# Patient Record
Sex: Female | Born: 1980 | Race: Black or African American | Hispanic: No | Marital: Single | Smoking: Light tobacco smoker
Health system: Southern US, Community
[De-identification: ages and names within clinical notes are randomized; demographics above are authoritative.]

## PROBLEM LIST (undated history)

## (undated) DIAGNOSIS — Z789 Other specified health status: Secondary | ICD-10-CM

## (undated) DIAGNOSIS — IMO0002 Reserved for concepts with insufficient information to code with codable children: Secondary | ICD-10-CM

## (undated) HISTORY — PX: APPENDECTOMY: SHX54

## (undated) HISTORY — PX: ABDOMINAL HYSTERECTOMY: SHX81

---
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
## 2002-02-11 ENCOUNTER — Emergency Department (HOSPITAL_COMMUNITY): Admission: EM | Admit: 2002-02-11 | Discharge: 2002-02-11 | Payer: Self-pay | Admitting: Emergency Medicine

## 2004-04-28 ENCOUNTER — Emergency Department (HOSPITAL_COMMUNITY): Admission: EM | Admit: 2004-04-28 | Discharge: 2004-04-28 | Payer: Self-pay | Admitting: Emergency Medicine

## 2005-02-13 ENCOUNTER — Ambulatory Visit: Payer: Self-pay | Admitting: Family Medicine

## 2005-02-27 ENCOUNTER — Ambulatory Visit: Payer: Self-pay | Admitting: Family Medicine

## 2005-03-03 ENCOUNTER — Ambulatory Visit: Payer: Self-pay | Admitting: *Deleted

## 2006-05-12 ENCOUNTER — Emergency Department (HOSPITAL_COMMUNITY): Admission: EM | Admit: 2006-05-12 | Discharge: 2006-05-13 | Payer: Self-pay | Admitting: Emergency Medicine

## 2006-05-28 ENCOUNTER — Ambulatory Visit: Payer: Self-pay | Admitting: Family Medicine

## 2006-07-19 ENCOUNTER — Ambulatory Visit: Payer: Self-pay | Admitting: Family Medicine

## 2006-07-19 ENCOUNTER — Encounter (INDEPENDENT_AMBULATORY_CARE_PROVIDER_SITE_OTHER): Payer: Self-pay | Admitting: Family Medicine

## 2007-01-23 ENCOUNTER — Encounter (INDEPENDENT_AMBULATORY_CARE_PROVIDER_SITE_OTHER): Payer: Self-pay | Admitting: *Deleted

## 2007-03-29 ENCOUNTER — Emergency Department (HOSPITAL_COMMUNITY): Admission: EM | Admit: 2007-03-29 | Discharge: 2007-03-30 | Payer: Self-pay | Admitting: Emergency Medicine

## 2007-04-04 ENCOUNTER — Emergency Department (HOSPITAL_COMMUNITY): Admission: EM | Admit: 2007-04-04 | Discharge: 2007-04-04 | Payer: Self-pay | Admitting: Emergency Medicine

## 2007-04-12 ENCOUNTER — Emergency Department (HOSPITAL_COMMUNITY): Admission: EM | Admit: 2007-04-12 | Discharge: 2007-04-12 | Payer: Self-pay | Admitting: Family Medicine

## 2007-05-16 ENCOUNTER — Emergency Department (HOSPITAL_COMMUNITY): Admission: EM | Admit: 2007-05-16 | Discharge: 2007-05-16 | Payer: Self-pay | Admitting: Emergency Medicine

## 2007-07-02 ENCOUNTER — Emergency Department (HOSPITAL_COMMUNITY): Admission: EM | Admit: 2007-07-02 | Discharge: 2007-07-03 | Payer: Self-pay | Admitting: Emergency Medicine

## 2007-07-24 ENCOUNTER — Encounter (HOSPITAL_COMMUNITY): Admission: RE | Admit: 2007-07-24 | Discharge: 2007-10-22 | Payer: Self-pay | Admitting: Emergency Medicine

## 2008-09-22 ENCOUNTER — Emergency Department (HOSPITAL_COMMUNITY): Admission: EM | Admit: 2008-09-22 | Discharge: 2008-09-22 | Payer: Self-pay | Admitting: Emergency Medicine

## 2009-02-21 ENCOUNTER — Inpatient Hospital Stay (HOSPITAL_COMMUNITY): Admission: EM | Admit: 2009-02-21 | Discharge: 2009-02-22 | Payer: Self-pay | Admitting: Emergency Medicine

## 2009-02-21 ENCOUNTER — Encounter (INDEPENDENT_AMBULATORY_CARE_PROVIDER_SITE_OTHER): Payer: Self-pay | Admitting: Surgery

## 2009-12-28 ENCOUNTER — Emergency Department (HOSPITAL_COMMUNITY): Admission: EM | Admit: 2009-12-28 | Discharge: 2009-12-28 | Payer: Self-pay | Admitting: Emergency Medicine

## 2010-07-22 LAB — CBC
Hemoglobin: 13.2 g/dL (ref 12.0–15.0)
MCH: 32.9 pg (ref 26.0–34.0)
Platelets: 308 10*3/uL (ref 150–400)
RBC: 4.01 MIL/uL (ref 3.87–5.11)
WBC: 12 10*3/uL — ABNORMAL HIGH (ref 4.0–10.5)

## 2010-07-22 LAB — GC/CHLAMYDIA PROBE AMP, GENITAL
Chlamydia, DNA Probe: NEGATIVE
GC Probe Amp, Genital: NEGATIVE

## 2010-07-22 LAB — DIFFERENTIAL
Eosinophils Absolute: 0.2 10*3/uL (ref 0.0–0.7)
Lymphocytes Relative: 29 % (ref 12–46)
Lymphs Abs: 3.5 10*3/uL (ref 0.7–4.0)
Monocytes Relative: 6 % (ref 3–12)
Neutro Abs: 7.4 10*3/uL (ref 1.7–7.7)
Neutrophils Relative %: 62 % (ref 43–77)

## 2010-07-22 LAB — WET PREP, GENITAL: WBC, Wet Prep HPF POC: NONE SEEN

## 2010-07-22 LAB — URINALYSIS, ROUTINE W REFLEX MICROSCOPIC
Bilirubin Urine: NEGATIVE
Glucose, UA: NEGATIVE mg/dL
Protein, ur: NEGATIVE mg/dL

## 2010-07-22 LAB — RPR: RPR Ser Ql: NONREACTIVE

## 2010-08-11 LAB — CBC
HCT: 35.1 % — ABNORMAL LOW (ref 36.0–46.0)
MCV: 96.8 fL (ref 78.0–100.0)
Platelets: 326 10*3/uL (ref 150–400)
RDW: 14.7 % (ref 11.5–15.5)
WBC: 13.6 10*3/uL — ABNORMAL HIGH (ref 4.0–10.5)

## 2010-08-11 LAB — URINALYSIS, ROUTINE W REFLEX MICROSCOPIC
Nitrite: NEGATIVE
Protein, ur: NEGATIVE mg/dL
Specific Gravity, Urine: 1.041 — ABNORMAL HIGH (ref 1.005–1.030)
Urobilinogen, UA: 0.2 mg/dL (ref 0.0–1.0)

## 2010-08-11 LAB — WET PREP, GENITAL
Trich, Wet Prep: NONE SEEN
Yeast Wet Prep HPF POC: NONE SEEN

## 2010-08-11 LAB — POCT I-STAT, CHEM 8
BUN: 17 mg/dL (ref 6–23)
Chloride: 104 mEq/L (ref 96–112)
HCT: 36 % (ref 36.0–46.0)
Potassium: 4.4 mEq/L (ref 3.5–5.1)

## 2010-08-11 LAB — URINE MICROSCOPIC-ADD ON

## 2010-08-11 LAB — DIFFERENTIAL
Blasts: 0 %
Eosinophils Absolute: 0.3 10*3/uL (ref 0.0–0.7)
Eosinophils Relative: 2 % (ref 0–5)
Metamyelocytes Relative: 0 %
Monocytes Relative: 5 % (ref 3–12)
Myelocytes: 0 %
Neutro Abs: 9.1 10*3/uL — ABNORMAL HIGH (ref 1.7–7.7)
Neutrophils Relative %: 67 % (ref 43–77)
nRBC: 0 /100 WBC

## 2010-08-11 LAB — URINE CULTURE: Colony Count: 50000

## 2010-08-11 LAB — RPR: RPR Ser Ql: NONREACTIVE

## 2011-02-14 LAB — D-DIMER, QUANTITATIVE: D-Dimer, Quant: 0.3

## 2011-05-09 DIAGNOSIS — R87619 Unspecified abnormal cytological findings in specimens from cervix uteri: Secondary | ICD-10-CM

## 2011-05-09 DIAGNOSIS — IMO0002 Reserved for concepts with insufficient information to code with codable children: Secondary | ICD-10-CM

## 2011-05-09 HISTORY — DX: Reserved for concepts with insufficient information to code with codable children: IMO0002

## 2011-05-09 HISTORY — DX: Unspecified abnormal cytological findings in specimens from cervix uteri: R87.619

## 2012-03-23 ENCOUNTER — Encounter (HOSPITAL_COMMUNITY): Payer: Self-pay | Admitting: Obstetrics and Gynecology

## 2012-03-23 ENCOUNTER — Inpatient Hospital Stay (HOSPITAL_COMMUNITY)
Admission: AD | Admit: 2012-03-23 | Discharge: 2012-03-23 | Disposition: A | Discharge status: Home / Self Care | Payer: Medicaid Other | Source: Ambulatory Visit | Attending: Obstetrics & Gynecology | Admitting: Obstetrics & Gynecology

## 2012-03-23 ENCOUNTER — Inpatient Hospital Stay (HOSPITAL_COMMUNITY): Payer: Medicaid Other

## 2012-03-23 DIAGNOSIS — R1032 Left lower quadrant pain: Secondary | ICD-10-CM | POA: Insufficient documentation

## 2012-03-23 DIAGNOSIS — D259 Leiomyoma of uterus, unspecified: Secondary | ICD-10-CM

## 2012-03-23 HISTORY — DX: Reserved for concepts with insufficient information to code with codable children: IMO0002

## 2012-03-23 LAB — URINALYSIS, ROUTINE W REFLEX MICROSCOPIC
Bilirubin Urine: NEGATIVE
Glucose, UA: NEGATIVE mg/dL
Hgb urine dipstick: NEGATIVE
Specific Gravity, Urine: 1.025 (ref 1.005–1.030)
Urobilinogen, UA: 0.2 mg/dL (ref 0.0–1.0)
pH: 6 (ref 5.0–8.0)

## 2012-03-23 LAB — WET PREP, GENITAL
Clue Cells Wet Prep HPF POC: NONE SEEN
Trich, Wet Prep: NONE SEEN
Yeast Wet Prep HPF POC: NONE SEEN

## 2012-03-23 LAB — POCT PREGNANCY, URINE: Preg Test, Ur: NEGATIVE

## 2012-03-23 MED ORDER — IBUPROFEN 800 MG PO TABS: 800.0000 mg | ORAL_TABLET | Freq: Three times a day (TID) | ORAL | Status: DC | PRN

## 2012-03-23 NOTE — MAU Note (Signed)
"  I was sleeping in bed last night and I just started having a pain in my LLQ area.  It is mainly sore now.  No pain or burning with urination, but I have had increased frequency for the past 2 days."

## 2012-03-23 NOTE — MAU Note (Signed)
First noted pain last night.  Hard to get comfortable when in bed.  Denies any pain or burning with urination, does have frequency- states sometimes does not feel like bladder is empty.  Sometimes is nauseated if goes a long time without eating.  No constipation, has occ diarrhea- not a new issue.

## 2012-03-23 NOTE — MAU Provider Note (Signed)
Attestation of Attending Supervision of Advanced Practitioner (CNM/NP): Evaluation and management procedures were performed by the Advanced Practitioner under my supervision and collaboration. I have reviewed the Advanced Practitioner's note and chart, and I agree with the management and plan.  Jailan Trimm H. 4:34 PM   

## 2012-03-23 NOTE — MAU Provider Note (Signed)
History     CSN: [id number]  Arrival date and time: 03/23/12 1143   None     Chief Complaint  Patient presents with  . Abdominal Pain   Abdominal Pain This is a new problem. The current episode started today. The onset quality is sudden. The problem occurs constantly. The problem has been gradually improving. The pain is located in the LLQ. The pain is mild. The abdominal pain radiates to the RLQ. Pertinent negatives include no constipation, diarrhea (pt does report some diarrhea, but states this is a ongoing issue that she feels is related to her alcohol intake), dysuria, fever, frequency, hematuria, nausea or vomiting. Nothing aggravates the pain. The pain is relieved by nothing. She has tried nothing for the symptoms. Her past medical history is significant for abdominal surgery (appendectomy).   Past Medical History  Diagnosis Date  . Abnormal Pap smear 2013    "a few months ago.  I have to have it repeated"    Past Surgical History  Procedure Date  . Appendectomy 4 yrs ago    Family History  Problem Relation Age of Onset  . Cancer Father   . Cancer Paternal Aunt   . Cancer Paternal Grandmother     History  Substance Use Topics  . Smoking status: Light Tobacco Smoker -- 0.2 packs/day for 2 years    Types: Cigarettes  . Smokeless tobacco: Not on file  . Alcohol Use: 7.8 oz/week    5 Cans of beer, 8 Shots of liquor per week    Allergies: No Known Allergies  No prescriptions prior to admission    Review of Systems  Constitutional: Negative.  Negative for fever.  Respiratory: Negative.   Cardiovascular: Negative.   Gastrointestinal: Positive for abdominal pain. Negative for nausea, vomiting, diarrhea (pt does report some diarrhea, but states this is a ongoing issue that she feels is related to her alcohol intake) and constipation.  Genitourinary: Negative for dysuria, urgency, frequency, hematuria and flank pain.       Negative for vaginal bleeding, vaginal  discharge, dyspareunia  Musculoskeletal: Negative.   Neurological: Negative.   Psychiatric/Behavioral: Negative.    Physical Exam   Blood pressure 136/88, pulse 92, temperature 98.8 F (37.1 C), temperature source Oral, resp. rate 18, height 5\' 1"  (1.549 m), weight 191 lb (86.637 kg), last menstrual period 02/24/2012.  Physical Exam  Nursing note and vitals reviewed. Constitutional: She is oriented to person, place, and time. She appears well-developed and well-nourished. No distress.  HENT:  Head: Normocephalic and atraumatic.  Cardiovascular: Normal rate.   Respiratory: Effort normal. No respiratory distress.  GI: Soft. She exhibits no distension and no mass. There is no tenderness. There is no rebound and no guarding.  Genitourinary: There is no rash or lesion on the right labia. There is no rash or lesion on the left labia. Uterus is not deviated, not enlarged, not fixed and not tender. Cervix exhibits no motion tenderness, no discharge and no friability. Right adnexum displays no mass, no tenderness and no fullness. Left adnexum displays tenderness and fullness. Left adnexum displays no mass. No erythema, tenderness or bleeding around the vagina. Vaginal discharge (creamy white) found.  Neurological: She is alert and oriented to person, place, and time.  Skin: Skin is warm and dry.  Psychiatric: She has a normal mood and affect.    MAU Course  Procedures Results for orders placed during the hospital encounter of 03/23/12 (from the past 24 hour(s))  URINALYSIS, ROUTINE W REFLEX  MICROSCOPIC     Status: Normal   Collection Time   03/23/12 11:50 AM      Component Value Range   Color, Urine YELLOW  YELLOW   APPearance CLEAR  CLEAR   Specific Gravity, Urine 1.025  1.005 - 1.030   pH 6.0  5.0 - 8.0   Glucose, UA NEGATIVE  NEGATIVE mg/dL   Hgb urine dipstick NEGATIVE  NEGATIVE   Bilirubin Urine NEGATIVE  NEGATIVE   Ketones, ur NEGATIVE  NEGATIVE mg/dL   Protein, ur NEGATIVE   NEGATIVE mg/dL   Urobilinogen, UA 0.2  0.0 - 1.0 mg/dL   Nitrite NEGATIVE  NEGATIVE   Leukocytes, UA NEGATIVE  NEGATIVE  POCT PREGNANCY, URINE     Status: Normal   Collection Time   03/23/12 12:01 PM      Component Value Range   Preg Test, Ur NEGATIVE  NEGATIVE  WET PREP, GENITAL     Status: Abnormal   Collection Time   03/23/12 12:34 PM      Component Value Range   Yeast Wet Prep HPF POC NONE SEEN  NONE SEEN   Trich, Wet Prep NONE SEEN  NONE SEEN   Clue Cells Wet Prep HPF POC NONE SEEN  NONE SEEN   WBC, Wet Prep HPF POC FEW (*) NONE SEEN   US Transvaginal Non-ob  03/23/2012  *RADIOLOGY REPORT*  Clinical Data: Left adnexal pain.  TRANSABDOMINAL AND TRANSVAGINAL ULTRASOUND OF PELVIS Technique:  Both transabdominal and transvaginal ultrasound examinations of the pelvis were performed. Transabdominal technique was performed for global imaging of the pelvis including uterus, ovaries, adnexal regions, and pelvic cul-de-sac.  It was necessary to proceed with endovaginal exam following the transabdominal exam to visualize the all adnexa.  Comparison:  No priors.  Findings:  Uterus: Markedly heterogeneous in echotexture with numerous focal lesions most compatible with fibroids.  22 x 7.5 x 17.9 cm.  The largest single fibroid is on the right side measuring up to 11.2 x 8.6 x 11.9 cm.  There are at least six separate lesions (likely more) visualized.  Endometrium: Largely obscured, but the visualized portion measures up to 7.7 mm in thickness.  Right ovary:  3.8 x 2.8 x 2.8 cm.  Normal in echotexture and appearance with multiple small follicles.  Left ovary: Could not be visualized.  Other findings: No free fluid the cul-de-sac.  IMPRESSION: 1.  Fibroid uterus, as above. 2.  Normal sonographic appearance of the right ovary. 3.  Limited examination which was not able to visualize the left ovary.   Original Report Authenticated By: Trudie Reed, M.D.    US Pelvis Complete  03/23/2012  *RADIOLOGY  REPORT*  Clinical Data: Left adnexal pain.  TRANSABDOMINAL AND TRANSVAGINAL ULTRASOUND OF PELVIS Technique:  Both transabdominal and transvaginal ultrasound examinations of the pelvis were performed. Transabdominal technique was performed for global imaging of the pelvis including uterus, ovaries, adnexal regions, and pelvic cul-de-sac.  It was necessary to proceed with endovaginal exam following the transabdominal exam to visualize the all adnexa.  Comparison:  No priors.  Findings:  Uterus: Markedly heterogeneous in echotexture with numerous focal lesions most compatible with fibroids.  22 x 7.5 x 17.9 cm.  The largest single fibroid is on the right side measuring up to 11.2 x 8.6 x 11.9 cm.  There are at least six separate lesions (likely more) visualized.  Endometrium: Largely obscured, but the visualized portion measures up to 7.7 mm in thickness.  Right ovary:  3.8 x 2.8 x  2.8 cm.  Normal in echotexture and appearance with multiple small follicles.  Left ovary: Could not be visualized.  Other findings: No free fluid the cul-de-sac.  IMPRESSION: 1.  Fibroid uterus, as above. 2.  Normal sonographic appearance of the right ovary. 3.  Limited examination which was not able to visualize the left ovary.   Original Report Authenticated By: Trudie Reed, M.D.     Assessment and Plan   1. Fibroid uterus       Medication List     As of 03/23/2012  2:53 PM    START taking these medications         ibuprofen 800 MG tablet   Commonly known as: ADVIL,MOTRIN   Take 1 tablet (800 mg total) by mouth every 8 (eight) hours as needed for pain.          Where to get your medications    These are the prescriptions that you need to pick up. We sent them to a specific pharmacy, so you will need to go there to get them.   [id number] [place], [place] - [address]    [address] [place] [address]    Phone: [phone]        ibuprofen 800 MG tablet             Follow-up Information    Follow up with [facility]. (someone will call to schedule an appointment)    Contact information:   [address] [place] [address] [phone]           Gurney Balthazor 03/23/2012, 2:53 PM

## 2012-03-25 LAB — GC/CHLAMYDIA PROBE AMP, GENITAL: Chlamydia, DNA Probe: NEGATIVE

## 2012-04-24 ENCOUNTER — Ambulatory Visit (INDEPENDENT_AMBULATORY_CARE_PROVIDER_SITE_OTHER): Payer: Medicaid Other | Admitting: Obstetrics & Gynecology

## 2012-04-24 ENCOUNTER — Other Ambulatory Visit (HOSPITAL_COMMUNITY)
Admission: RE | Admit: 2012-04-24 | Discharge: 2012-04-24 | Disposition: A | Discharge status: Home / Self Care | Payer: Medicaid Other | Source: Ambulatory Visit | Attending: Obstetrics & Gynecology | Admitting: Obstetrics & Gynecology

## 2012-04-24 ENCOUNTER — Encounter: Payer: Self-pay | Admitting: Obstetrics & Gynecology

## 2012-04-24 VITALS — BP 126/87 | HR 95 | Temp 96.9°F | Ht 61.0 in | Wt 196.1 lb

## 2012-04-24 DIAGNOSIS — Z01419 Encounter for gynecological examination (general) (routine) without abnormal findings: Secondary | ICD-10-CM

## 2012-04-24 DIAGNOSIS — D259 Leiomyoma of uterus, unspecified: Secondary | ICD-10-CM

## 2012-04-24 DIAGNOSIS — Z0000 Encounter for general adult medical examination without abnormal findings: Secondary | ICD-10-CM

## 2012-04-24 DIAGNOSIS — D219 Benign neoplasm of connective and other soft tissue, unspecified: Secondary | ICD-10-CM

## 2012-04-24 LAB — CBC
MCV: 96.5 fL (ref 78.0–100.0)
Platelets: 437 10*3/uL — ABNORMAL HIGH (ref 150–400)
RBC: 3.98 MIL/uL (ref 3.87–5.11)
WBC: 10.9 10*3/uL — ABNORMAL HIGH (ref 4.0–10.5)

## 2012-04-24 NOTE — Progress Notes (Signed)
  Subjective:    Patient ID: Sara Barber, female    DOB: [birth year], 31 y.o.   MRN: [id number]  HPI  31 yo S AA G0 who is here for follow up of her MAU visit last month. She gets her pap smears at [facility] and is due for a follow up pap. She was seen in the MAU for pelvic pain. An u/s showed a 22 cm uterus with fibroids. She does not want children and is in an 8 year monogamous same- sex relationship. Review of Systems     Objective:   Physical Exam   Normal appearing cervix, nulliparous, scant discharge, some bleeding Uterus well above the umbilicus, with what feels like a pedunculated fibroid on the right side of the uterus     Assessment & Plan:   Preventative care- pap smear today Large painful fibroids- I will give her information on TAH as well as Uterine artery embolization. I will check a TSH and CBC.

## 2012-04-24 NOTE — Patient Instructions (Addendum)
Hysterectomy Information  A hysterectomy is a procedure where your uterus is surgically removed. It will no longer be possible to have menstrual periods or to become pregnant. The tubes and ovaries can be removed (bilateral salpingo-oopherectomy) during this surgery as well.  REASONS FOR A HYSTERECTOMY  Persistent, abnormal bleeding.  Lasting (chronic) pelvic pain or infection.  The lining of the uterus (endometrium) starts growing outside the uterus (endometriosis).  The endometrium starts growing in the muscle of the uterus (adenomyosis).  The uterus falls down into the vagina (pelvic organ prolapse).  Symptomatic uterine fibroids.  Precancerous cells.  Cervical cancer or uterine cancer. TYPES OF HYSTERECTOMIES  Supracervical hysterectomy. This type removes the top part of the uterus, but not the cervix.  Total hysterectomy. This type removes the uterus and cervix.  Radical hysterectomy. This type removes the uterus, cervix, and the fibrous tissue that holds the uterus in place in the pelvis (parametrium). WAYS A HYSTERECTOMY CAN BE PERFORMED  Abdominal hysterectomy. A large surgical cut (incision) is made in the abdomen. The uterus is removed through this incision.  Vaginal hysterectomy. An incision is made in the vagina. The uterus is removed through this incision. There are no abdominal incisions.  Conventional laparoscopic hysterectomy. A thin, lighted tube with a camera (laparoscope) is inserted into 3 or 4 small incisions in the abdomen. The uterus is cut into small pieces. The small pieces are removed through the incisions, or they are removed through the vagina.  Laparoscopic assisted vaginal hysterectomy (LAVH). Three or four small incisions are made in the abdomen. Part of the surgery is performed laparoscopically and part vaginally. The uterus is removed through the vagina.  Robot-assisted laparoscopic hysterectomy. A laparoscope is inserted into 3 or 4 small  incisions in the abdomen. A computer-controlled device is used to give the surgeon a 3D image. This allows for more precise movements of surgical instruments. The uterus is cut into small pieces and removed through the incisions or removed through the vagina. RISKS OF HYSTERECTOMY   Bleeding and risk of blood transfusion. Tell your caregiver if you do not want to receive any blood products.  Blood clots in the legs or lung.  Infection.  Injury to surrounding organs.  Anesthesia problems or side effects.  Conversion to an abdominal hysterectomy. WHAT TO EXPECT AFTER A HYSTERECTOMY  You will be given pain medicine.  You will need to have someone with you for the first 3 to 5 days after you go home.  You will need to follow up with your surgeon in 2 to 4 weeks after surgery to evaluate your progress.  You may have early menopause symptoms like hot flashes, night sweats, and insomnia.  If you had a hysterectomy for a problem that was not a cancer or a condition that could lead to cancer, then you no longer need Pap tests. However, even if you no longer need a Pap test, a regular exam is a good idea to make sure no other problems are starting. Document Released: 10/18/2000 Document Revised: 07/17/2011 Document Reviewed: 12/03/2010 [facility] Patient Information 2013 [place], [place]. Fibroids Fibroids are lumps (tumors) that can occur any place in a woman's body. These lumps are not cancerous. Fibroids vary in size, weight, and where they grow. HOME CARE  Do not take aspirin.  Write down the number of pads or tampons you use during your period. Tell your doctor. This can help determine the best treatment for you. GET HELP RIGHT AWAY IF:  You have pain in  your lower belly (abdomen) that is not helped with medicine.  You have cramps that are not helped with medicine.  You have more bleeding between or during your period.  You feel lightheaded or pass out (faint).  Your lower belly  pain gets worse. MAKE SURE YOU:  Understand these instructions.  Will watch your condition.  Will get help right away if you are not doing well or get worse. Document Released: 05/27/2010 Document Revised: 07/17/2011 Document Reviewed: 05/27/2010 [facility] Patient Information 2013 [place], [place]. Uterine Artery Embolization for Fibroids Uterine fibroids are non-cancerous (benign) smooth muscle tumors of the uterus. When they become large, they may produce symptoms of pain and bleeding. Fibroids are sometimes individually removed during surgery or removed with the uterus (hysterectomy).  One non-surgical treatment used to shrink fibroids is called uterine artery embolization. A specialist (interventional radiologist) uses a thin plastic hose(catheter) to inject material that blocks off the blood supply to the fibroid. In time, this causes the fibroid to shrink. PROCEDURE  Under local anesthetic (a medication that numbs part of the body) the radiologist makes a small cut in the groin. A catheter is then inserted into the main artery of the leg. Using fluoroscopy, your radiologist guides the catheter through the artery to the uterus. A series of images are taken while dye is injected. This is done to provide a road map of the blood supply to the uterus and fibroids. Tiny plastic spheres about the size of sand grains are then injected through the catheter. Metal coils may sometimes also be used to help block the artery. The particles lodge in tiny branches of the uterine artery that supplies blood to the fibroids. The procedure is repeated on the artery that supplies the other side of the uterus. The hospital stay is usually overnight. Normal activity can resume after about a week. Mild pain and cramping following the procedure is easily treated with medication and anti-inflammatory drugs. These usually last only a couple days.  RISKS AND COMPLICATIONS  Injury to the uterus from decreased blood supply  may happen.  This could require removal of the uterus (hysterectomy).  Pain and bleeding can occur.  Infection and abscess (a cyst filled with pus).  A cyst filled with blood (hematoma).  Blood infection (septicemia).  Amenorrhea (no menstrual period).  Dying of tissue cells that cannot recover (necrosis) to the bladder or lips of the vagina.  Fistula (a connection between organs or from organ to the skin).  Blood clot in the lung (pulmonary embolus).  Rarely death. EXPECTED OUTCOME An ultrasound or MRI is done in 6 months to make sure the fibroids have shrunk. The fibroids usually shrink to about half their original size. In most cases these effects are long lasting.   The uterus also shrinks but does not die. You may not be able to get pregnant following this procedure.  It cannot be estimated what the effects of the procedure will be on menses. Usually there is less bleeding.  The procedure may cause premature menopause or loss of menstrual cycle. HOME CARE INSTRUCTIONS   Follow your caregiver's advice regarding medications given to you, diet, activity and when to begin sexual activity.  See your caregiver for follow up care as directed.  Do not take aspirin it can cause bleeding. Only take over-the-counter or prescription medicines for pain, discomfort, or fever as directed by your caregiver.  Care for and change dressing as directed. SEEK MEDICAL CARE IF:   You develop a temperature of  102 F (38.9 C) or higher.  There is redness, swelling and pain around the wound.  You have pus draining from the wound.  You develop a rash. SEEK IMMEDIATE MEDICAL CARE IF:   You have bleeding from the wound.  You have difficulty breathing.  You develop chest pain.  You develop belly (abdominal) pain.  You develop leg pain.  You become dizzy and pass out. Document Released: 07/10/2005 Document Revised: 07/17/2011 Document Reviewed: 06/06/2007 [facility] Patient  Information 2013 [place], [place].

## 2012-05-29 ENCOUNTER — Ambulatory Visit: Payer: Medicaid Other | Admitting: Obstetrics & Gynecology

## 2012-06-03 ENCOUNTER — Ambulatory Visit (INDEPENDENT_AMBULATORY_CARE_PROVIDER_SITE_OTHER): Payer: Medicaid Other | Admitting: Obstetrics & Gynecology

## 2012-06-03 ENCOUNTER — Encounter: Payer: Self-pay | Admitting: Obstetrics & Gynecology

## 2012-06-03 VITALS — BP 128/81 | HR 100 | Temp 97.3°F | Ht 60.0 in | Wt 197.1 lb

## 2012-06-03 DIAGNOSIS — D219 Benign neoplasm of connective and other soft tissue, unspecified: Secondary | ICD-10-CM

## 2012-06-03 DIAGNOSIS — D259 Leiomyoma of uterus, unspecified: Secondary | ICD-10-CM

## 2012-06-03 NOTE — Progress Notes (Signed)
  Subjective:    Patient ID: Sara Barber, female    DOB: January 25, 1981, 32 y.o.   MRN: [id number]  HPI  She is here today to discuss her options for her 22 cm fibroid uterus. She declines [place], depo Lupron. She wants a [facility].  Review of Systems Pap normal 12/13 She declines a flu vaccine today.    Objective:   Physical Exam        Assessment & Plan:   Pelvic pain, 22 week size uterus- I sent [place] an email to schedule her surgery. She would like a depo Lupron shot until her surgery.

## 2012-07-12 ENCOUNTER — Encounter (HOSPITAL_COMMUNITY): Payer: Self-pay | Admitting: Pharmacist

## 2012-07-24 ENCOUNTER — Encounter (HOSPITAL_COMMUNITY): Payer: Self-pay

## 2012-07-24 ENCOUNTER — Encounter (HOSPITAL_COMMUNITY)
Admission: RE | Admit: 2012-07-24 | Discharge: 2012-07-24 | Disposition: A | Discharge status: Home / Self Care | Payer: Medicaid Other | Source: Ambulatory Visit | Attending: Obstetrics & Gynecology | Admitting: Obstetrics & Gynecology

## 2012-07-24 HISTORY — DX: Other specified health status: Z78.9

## 2012-07-24 LAB — CBC
Hemoglobin: 12.1 g/dL (ref 12.0–15.0)
MCH: 31.6 pg (ref 26.0–34.0)
RBC: 3.83 MIL/uL — ABNORMAL LOW (ref 3.87–5.11)
WBC: 10.3 10*3/uL (ref 4.0–10.5)

## 2012-07-24 LAB — SURGICAL PCR SCREEN
MRSA, PCR: INVALID — AB
Staphylococcus aureus: INVALID — AB

## 2012-07-24 NOTE — Patient Instructions (Addendum)
20 Sara Barber  07/24/2012   Your procedure is scheduled on:  07/29/12  Enter through the Main Entrance of [facility] at 8 AM.  Pick up the phone at the desk and dial [phone].   Call this number if you have problems the morning of surgery: [phone]   Remember:   Do not eat food:After Midnight.  Do not drink clear liquids: After Midnight.  Take these medicines the morning of surgery with A SIP OF WATER: NA   Do not wear jewelry, make-up or nail polish.  Do not wear lotions, powders, or perfumes. You may wear deodorant.  Do not shave 48 hours prior to surgery.  Do not bring valuables to the hospital.  Contacts, dentures or bridgework may not be worn into surgery.  Leave suitcase in the car. After surgery it may be brought to your room.  For patients admitted to the hospital, checkout time is 11:00 AM the day of discharge.   Patients discharged the day of surgery will not be allowed to drive home.  Name and phone number of your driver: NA  Special Instructions: Shower using CHG 2 nights before surgery and the night before surgery.  If you shower the day of surgery use CHG.  Use special wash - you have one bottle of CHG for all showers.  You should use approximately 1/3 of the bottle for each shower.   Please read over the following fact sheets that you were given: MRSA Information

## 2012-07-26 LAB — MRSA CULTURE

## 2012-07-29 ENCOUNTER — Encounter (HOSPITAL_COMMUNITY): Payer: Self-pay | Admitting: Anesthesiology

## 2012-07-29 ENCOUNTER — Encounter (HOSPITAL_COMMUNITY)
Admission: RE | Disposition: A | Discharge status: Home / Self Care | Payer: Self-pay | Source: Ambulatory Visit | Attending: Obstetrics & Gynecology

## 2012-07-29 ENCOUNTER — Inpatient Hospital Stay (HOSPITAL_COMMUNITY): Payer: Medicaid Other | Admitting: Anesthesiology

## 2012-07-29 ENCOUNTER — Inpatient Hospital Stay (HOSPITAL_COMMUNITY)
Admission: RE | Admit: 2012-07-29 | Discharge: 2012-07-31 | DRG: 743 | Disposition: A | Discharge status: Home / Self Care | Payer: Medicaid Other | Source: Ambulatory Visit | Attending: Obstetrics & Gynecology | Admitting: Obstetrics & Gynecology

## 2012-07-29 DIAGNOSIS — D250 Submucous leiomyoma of uterus: Principal | ICD-10-CM | POA: Diagnosis present

## 2012-07-29 DIAGNOSIS — D259 Leiomyoma of uterus, unspecified: Secondary | ICD-10-CM

## 2012-07-29 DIAGNOSIS — D252 Subserosal leiomyoma of uterus: Secondary | ICD-10-CM | POA: Diagnosis present

## 2012-07-29 DIAGNOSIS — N920 Excessive and frequent menstruation with regular cycle: Secondary | ICD-10-CM | POA: Diagnosis present

## 2012-07-29 DIAGNOSIS — D251 Intramural leiomyoma of uterus: Secondary | ICD-10-CM | POA: Diagnosis present

## 2012-07-29 DIAGNOSIS — D219 Benign neoplasm of connective and other soft tissue, unspecified: Secondary | ICD-10-CM | POA: Diagnosis present

## 2012-07-29 DIAGNOSIS — N949 Unspecified condition associated with female genital organs and menstrual cycle: Secondary | ICD-10-CM | POA: Diagnosis present

## 2012-07-29 HISTORY — PX: SUPRACERVICAL ABDOMINAL HYSTERECTOMY: SHX5393

## 2012-07-29 LAB — PREGNANCY, URINE: Preg Test, Ur: NEGATIVE

## 2012-07-29 SURGERY — HYSTERECTOMY, SUPRACERVICAL, ABDOMINAL
Anesthesia: General | Site: Abdomen | Laterality: Bilateral | Wound class: Clean Contaminated

## 2012-07-29 MED ORDER — NEOSTIGMINE METHYLSULFATE 1 MG/ML IJ SOLN
INTRAMUSCULAR | Status: AC
  Filled 2012-07-29: qty 1

## 2012-07-29 MED ORDER — CEFAZOLIN SODIUM-DEXTROSE 2-3 GM-% IV SOLR
INTRAVENOUS | Status: AC
  Filled 2012-07-29: qty 50

## 2012-07-29 MED ORDER — FENTANYL CITRATE 0.05 MG/ML IJ SOLN
25.0000 ug | INTRAMUSCULAR | Status: DC | PRN
  Administered 2012-07-29: 50 ug via INTRAVENOUS

## 2012-07-29 MED ORDER — 0.9 % SODIUM CHLORIDE (POUR BTL) OPTIME
TOPICAL | Status: DC | PRN
  Administered 2012-07-29 (×2): 1000 mL

## 2012-07-29 MED ORDER — FENTANYL CITRATE 0.05 MG/ML IJ SOLN
INTRAMUSCULAR | Status: AC
  Filled 2012-07-29: qty 5

## 2012-07-29 MED ORDER — ONDANSETRON HCL 4 MG PO TABS: 4.0000 mg | ORAL_TABLET | Freq: Four times a day (QID) | ORAL | Status: DC | PRN

## 2012-07-29 MED ORDER — LIDOCAINE HCL (CARDIAC) 20 MG/ML IV SOLN
INTRAVENOUS | Status: AC
  Filled 2012-07-29: qty 5

## 2012-07-29 MED ORDER — ZOLPIDEM TARTRATE 5 MG PO TABS
5.0000 mg | ORAL_TABLET | Freq: Every evening | ORAL | Status: DC | PRN
  Administered 2012-07-30: 5 mg via ORAL
  Filled 2012-07-29: qty 1

## 2012-07-29 MED ORDER — LIDOCAINE HCL (CARDIAC) 20 MG/ML IV SOLN
INTRAVENOUS | Status: DC | PRN
  Administered 2012-07-29 (×2): 20 mg via INTRAVENOUS

## 2012-07-29 MED ORDER — MEPERIDINE HCL 25 MG/ML IJ SOLN
INTRAMUSCULAR | Status: DC | PRN
  Administered 2012-07-29: 12.5 mg via INTRAVENOUS

## 2012-07-29 MED ORDER — KETOROLAC TROMETHAMINE 30 MG/ML IJ SOLN
INTRAMUSCULAR | Status: AC
  Filled 2012-07-29: qty 1

## 2012-07-29 MED ORDER — PROPOFOL INFUSION 10 MG/ML OPTIME
INTRAVENOUS | Status: DC | PRN
  Administered 2012-07-29: 20 ug/kg/min via INTRAVENOUS
  Administered 2012-07-29: 50 ug/kg/min via INTRAVENOUS

## 2012-07-29 MED ORDER — MEPERIDINE HCL 25 MG/ML IJ SOLN
INTRAMUSCULAR | Status: AC
  Filled 2012-07-29: qty 1

## 2012-07-29 MED ORDER — MIDAZOLAM HCL 5 MG/5ML IJ SOLN
INTRAMUSCULAR | Status: DC | PRN
  Administered 2012-07-29 (×2): 1 mg via INTRAVENOUS

## 2012-07-29 MED ORDER — DEXAMETHASONE SODIUM PHOSPHATE 10 MG/ML IJ SOLN
INTRAMUSCULAR | Status: AC
  Filled 2012-07-29: qty 1

## 2012-07-29 MED ORDER — GLYCOPYRROLATE 0.2 MG/ML IJ SOLN
INTRAMUSCULAR | Status: AC
  Filled 2012-07-29: qty 2

## 2012-07-29 MED ORDER — MIDAZOLAM HCL 2 MG/2ML IJ SOLN
INTRAMUSCULAR | Status: AC
  Filled 2012-07-29: qty 2

## 2012-07-29 MED ORDER — CEFAZOLIN SODIUM-DEXTROSE 2-3 GM-% IV SOLR
2.0000 g | INTRAVENOUS | Status: AC
  Administered 2012-07-29: 2 g via INTRAVENOUS

## 2012-07-29 MED ORDER — ACETAMINOPHEN 10 MG/ML IV SOLN
1000.0000 mg | Freq: Once | INTRAVENOUS | Status: AC
  Administered 2012-07-29: 1000 mg via INTRAVENOUS
  Filled 2012-07-29: qty 100

## 2012-07-29 MED ORDER — MIDAZOLAM HCL 2 MG/2ML IJ SOLN: 0.5000 mg | Freq: Once | INTRAMUSCULAR | Status: DC | PRN

## 2012-07-29 MED ORDER — BUPIVACAINE HCL (PF) 0.5 % IJ SOLN
INTRAMUSCULAR | Status: DC | PRN
  Administered 2012-07-29: 30 mL

## 2012-07-29 MED ORDER — LACTATED RINGERS IV SOLN
INTRAVENOUS | Status: DC
  Administered 2012-07-29 (×6): via INTRAVENOUS

## 2012-07-29 MED ORDER — ONDANSETRON HCL 4 MG/2ML IJ SOLN
INTRAMUSCULAR | Status: AC
  Filled 2012-07-29: qty 2

## 2012-07-29 MED ORDER — ROCURONIUM BROMIDE 50 MG/5ML IV SOLN
INTRAVENOUS | Status: AC
  Filled 2012-07-29: qty 1

## 2012-07-29 MED ORDER — PHENYLEPHRINE HCL 10 MG/ML IJ SOLN
INTRAMUSCULAR | Status: DC | PRN
  Administered 2012-07-29 (×10): 40 ug via INTRAVENOUS

## 2012-07-29 MED ORDER — MEPERIDINE HCL 25 MG/ML IJ SOLN: 6.2500 mg | INTRAMUSCULAR | Status: DC | PRN

## 2012-07-29 MED ORDER — KETOROLAC TROMETHAMINE 30 MG/ML IJ SOLN: 15.0000 mg | Freq: Once | INTRAMUSCULAR | Status: DC | PRN

## 2012-07-29 MED ORDER — PROMETHAZINE HCL 25 MG/ML IJ SOLN: 6.2500 mg | INTRAMUSCULAR | Status: DC | PRN

## 2012-07-29 MED ORDER — PHENYLEPHRINE 40 MCG/ML (10ML) SYRINGE FOR IV PUSH (FOR BLOOD PRESSURE SUPPORT)
PREFILLED_SYRINGE | INTRAVENOUS | Status: AC
  Filled 2012-07-29: qty 10

## 2012-07-29 MED ORDER — IBUPROFEN 800 MG PO TABS
800.0000 mg | ORAL_TABLET | Freq: Three times a day (TID) | ORAL | Status: DC | PRN
  Administered 2012-07-29 – 2012-07-31 (×5): 800 mg via ORAL
  Filled 2012-07-29 (×5): qty 1

## 2012-07-29 MED ORDER — DOCUSATE SODIUM 100 MG PO CAPS
100.0000 mg | ORAL_CAPSULE | Freq: Two times a day (BID) | ORAL | Status: DC
  Administered 2012-07-29 – 2012-07-31 (×4): 100 mg via ORAL
  Filled 2012-07-29 (×4): qty 1

## 2012-07-29 MED ORDER — BUPIVACAINE HCL (PF) 0.5 % IJ SOLN
INTRAMUSCULAR | Status: AC
  Filled 2012-07-29: qty 30

## 2012-07-29 MED ORDER — HYDROMORPHONE HCL PF 1 MG/ML IJ SOLN
1.0000 mg | INTRAMUSCULAR | Status: DC | PRN
  Administered 2012-07-29 (×2): 1 mg via INTRAVENOUS
  Filled 2012-07-29 (×2): qty 1

## 2012-07-29 MED ORDER — BUPIVACAINE IN DEXTROSE 0.75-8.25 % IT SOLN
INTRATHECAL | Status: DC | PRN
  Administered 2012-07-29: 1.6 mL via INTRATHECAL

## 2012-07-29 MED ORDER — EPHEDRINE SULFATE 50 MG/ML IJ SOLN
INTRAMUSCULAR | Status: DC | PRN
  Administered 2012-07-29 (×2): 5 mg via INTRAVENOUS

## 2012-07-29 MED ORDER — ONDANSETRON HCL 4 MG/2ML IJ SOLN: 4.0000 mg | Freq: Four times a day (QID) | INTRAMUSCULAR | Status: DC | PRN

## 2012-07-29 MED ORDER — OXYCODONE-ACETAMINOPHEN 5-325 MG PO TABS
1.0000 | ORAL_TABLET | ORAL | Status: DC | PRN
  Administered 2012-07-29: 2 via ORAL
  Administered 2012-07-30: 1 via ORAL
  Administered 2012-07-30: 2 via ORAL
  Administered 2012-07-30: 1 via ORAL
  Administered 2012-07-30 (×2): 2 via ORAL
  Filled 2012-07-29: qty 1
  Filled 2012-07-29: qty 2
  Filled 2012-07-29: qty 1
  Filled 2012-07-29 (×3): qty 2

## 2012-07-29 MED ORDER — FENTANYL CITRATE 0.05 MG/ML IJ SOLN
INTRAMUSCULAR | Status: AC
  Administered 2012-07-29: 50 ug via INTRAVENOUS
  Filled 2012-07-29: qty 2

## 2012-07-29 MED ORDER — PROPOFOL 10 MG/ML IV EMUL
INTRAVENOUS | Status: AC
  Filled 2012-07-29: qty 20

## 2012-07-29 MED ORDER — KETOROLAC TROMETHAMINE 30 MG/ML IJ SOLN
INTRAMUSCULAR | Status: AC
  Administered 2012-07-29: 30 mg via INTRAMUSCULAR
  Filled 2012-07-29: qty 1

## 2012-07-29 MED ORDER — ONDANSETRON HCL 4 MG/2ML IJ SOLN
INTRAMUSCULAR | Status: DC | PRN
  Administered 2012-07-29: 4 mg via INTRAVENOUS

## 2012-07-29 SURGICAL SUPPLY — 25 items
CANISTER SUCTION 2500CC (MISCELLANEOUS) ×2 IMPLANT
CHLORAPREP W/TINT 26ML (MISCELLANEOUS) ×2 IMPLANT
CLOTH BEACON ORANGE TIMEOUT ST (SAFETY) ×2 IMPLANT
CONT PATH 16OZ SNAP LID 3702 (MISCELLANEOUS) ×2 IMPLANT
DECANTER SPIKE VIAL GLASS SM (MISCELLANEOUS) IMPLANT
DRSG OPSITE POSTOP 4X10 (GAUZE/BANDAGES/DRESSINGS) ×4 IMPLANT
GAUZE SPONGE 4X4 16PLY XRAY LF (GAUZE/BANDAGES/DRESSINGS) ×2 IMPLANT
GLOVE BIO SURGEON STRL SZ 6.5 (GLOVE) ×2 IMPLANT
GOWN PREVENTION PLUS LG XLONG (DISPOSABLE) ×6 IMPLANT
NEEDLE SPNL 18GX3.5 QUINCKE PK (NEEDLE) ×2 IMPLANT
NS IRRIG 1000ML POUR BTL (IV SOLUTION) ×2 IMPLANT
PACK ABDOMINAL GYN (CUSTOM PROCEDURE TRAY) ×2 IMPLANT
PAD OB MATERNITY 4.3X12.25 (PERSONAL CARE ITEMS) ×2 IMPLANT
PROTECTOR NERVE ULNAR (MISCELLANEOUS) ×2 IMPLANT
SPONGE LAP 18X18 X RAY DECT (DISPOSABLE) ×4 IMPLANT
STRIP CLOSURE SKIN 1/2X4 (GAUZE/BANDAGES/DRESSINGS) ×4 IMPLANT
SUT CHROMIC 3 0 SH 27 (SUTURE) IMPLANT
SUT VIC AB 0 CT1 36 (SUTURE) ×6 IMPLANT
SUT VIC AB 2-0 CT1 18 (SUTURE) ×4 IMPLANT
SUT VIC AB 3-0 CT1 27 (SUTURE) ×1
SUT VIC AB 3-0 CT1 TAPERPNT 27 (SUTURE) ×1 IMPLANT
SYR 30ML LL (SYRINGE) ×2 IMPLANT
TOWEL OR 17X24 6PK STRL BLUE (TOWEL DISPOSABLE) ×4 IMPLANT
TRAY FOLEY CATH 14FR (SET/KITS/TRAYS/PACK) ×2 IMPLANT
WATER STERILE IRR 1000ML POUR (IV SOLUTION) ×2 IMPLANT

## 2012-07-29 NOTE — Anesthesia Postprocedure Evaluation (Signed)
  Anesthesia Post-op Note  Anesthesia Post Note  Patient: Sara Barber  Procedure(s) Performed: Procedure(s) (LRB): HYSTERECTOMY SUPRACERVICAL ABDOMINAL (Bilateral)  Anesthesia type: General  Patient location: PACU  Post pain: Pain level controlled  Post assessment: Post-op Vital signs reviewed  Last Vitals:  Filed Vitals:   07/29/12 1440  BP: 102/68  Pulse: 82  Temp: 36.2 C  Resp: 18    Post vital signs: Reviewed  Level of consciousness: sedated  Complications: No apparent anesthesia complications

## 2012-07-29 NOTE — Op Note (Signed)
07/29/2012  11:22 AM  PATIENT:  Sara Barber  32 y.o. female  PRE-OPERATIVE DIAGNOSIS:  CPT [phone] - Fibroid uterus and pelvic pain  POST-OPERATIVE DIAGNOSIS:  same  PROCEDURE:  Procedure(s) with comments: HYSTERECTOMY SUPRACERVICAL ABDOMINAL (Bilateral) - With removal of both oviducts  SURGEON:  Surgeon(s) and Role:    * Allie Bossier, MD - Primary   PHYSICIAN ASSISTANT:   ASSISTANTS: Scheryl Darter, MD and Lorna Dibble, PA-s   ANESTHESIA:   spinal  EBL:  Total I/O In: 2500 [I.V.:2500] Out: 475 [Urine:275; Blood:200]  BLOOD ADMINISTERED:none  DRAINS: none   LOCAL MEDICATIONS USED:  MARCAINE     SPECIMEN:  Source of Specimen:  uterus and tubes  DISPOSITION OF SPECIMEN:  PATHOLOGY  COUNTS:  correct  TOURNIQUET:  * No tourniquets in log *  DICTATION: .Dragon Dictation  PLAN OF CARE: Admit to inpatient   PATIENT DISPOSITION:  PACU - hemodynamically stable.   Delay start of Pharmacological VTE agent (>24hrs) due to surgical blood loss or risk of bleeding: yes         The risks, benefits, and alternatives of surgery were explained, understood, and accepted. Consents were signed. All questions were answered. She was taken to the operating room and spinal anesthesia was applied without complication. Her abdomen and vagina were prepped and draped in the usual sterile fashion. A Foley catheter was placed which drained clear urine throughout the case. After adequate anesthesia was assured, 30 cc of 0.5% marcaine was injected into the subcutaneous tissue. A transverse incision was made approximately 2 cm above her symphysis pubis. The incision was carried down through the subcutaneous tissue to the fascia. Bleeding encountered was cauterized with the Bovie. The fascia was scored the midline and the fascial incision was extended bilaterally. The pyramidalis muscles were separated in a transverse fashion using electrosurgical technique. Approximately 2 cm of the rectus  muscles were separated in a transverse fashion in the midline using electrosurgical technique. Hemostasis was maintained. The peritoneum was entered with hemostats and the peritoneal incision was extended bilaterally with the Bovie, taking care to avoid bowel and bladder. Her very large uterus with multiple fibroids was lifted out of the incision. The ovaries appeared normal. Coker clamps were used to elevate the uterus. The round ligaments were identified clamped cut and ligated. A bladder flap was created anteriorly and the bladder was pushed out of the operative site with a moist lap sponge. The uteroovarian ligaments were identified bilaterally. They were clamped, cut, and ligated. Excellent hemostasis was noted. 2-0 Vicryl sutures used throughout this case unless otherwise specified. The uterine vessels were skeletonized, clamped, cut, and doubly ligated. The uterus was then removed from the cervix. The endocervix was cauterized to hopefully prevent any cyclic bleeding. The cervical stump was oversewed with a 0 vicryl suture in a running, locking fashion. It was noted to be hemostatic. The pelvis was irrigated with 1 L of warm normal saline. All pedicles were noted to be hemostatic. The ureters were noted to be functioning and of normal caliber.  The rectus muscles were inspected and hemostasis was assured. The fascia was closed with a 0 Vicryl running nonlocking suture. The subcutaneous tissue was irrigated, clean, dry.  A subcuticular closure was done with 3-0 vicryl suture. She tolerated the procedure well and was taken to the recovery room in stable condition. Her Foley catheter drained clear urine throughout.

## 2012-07-29 NOTE — Anesthesia Procedure Notes (Signed)
Spinal  Patient location during procedure: OR Start time: 07/29/2012 9:50 AM Staffing Anesthesiologist: Angus Seller., Harrell Gave. Performed by: anesthesiologist  Preanesthetic Checklist Completed: patient identified, site marked, surgical consent, pre-op evaluation, timeout performed, IV checked, risks and benefits discussed and monitors and equipment checked Spinal Block Patient position: sitting Prep: DuraPrep Patient monitoring: heart rate, cardiac monitor, continuous pulse ox and blood pressure Approach: midline Location: L3-4 Injection technique: single-shot Needle Needle type: Sprotte  Needle gauge: 24 G Needle length: 9 cm Assessment Sensory level: T4 Additional Notes Patient identified.  Risk benefits discussed including failed block, incomplete pain control, headache, nerve damage, paralysis, blood pressure changes, nausea, vomiting, reactions to medication both toxic or allergic, and postpartum back pain.  Patient expressed understanding and wished to proceed.  All questions were answered.  Sterile technique used throughout procedure.  CSF was clear.  No parasthesia or other complications.  Please see nursing notes for vital signs.

## 2012-07-29 NOTE — Preoperative (Signed)
Beta Blockers   Reason not to administer Beta Blockers:Not Applicable 

## 2012-07-29 NOTE — Anesthesia Postprocedure Evaluation (Signed)
  Anesthesia Post-op Note  Patient: Sara Barber  Procedure(s) Performed: Procedure(s) with comments: HYSTERECTOMY SUPRACERVICAL ABDOMINAL (Bilateral) - With removal of both oviducts  Patient Location: [facility]  Anesthesia Type:Spinal  Level of Consciousness: awake, alert  and oriented  Airway and Oxygen Therapy: Patient Spontanous Breathing and Patient connected to nasal cannula oxygen  Post-op Pain: moderate  Post-op Assessment: Post-op Vital signs reviewed and Patient's Cardiovascular Status Stable  Post-op Vital Signs: Reviewed and stable  Complications: No apparent anesthesia complications

## 2012-07-29 NOTE — H&P (Signed)
Sara Barber is an 32 y.o. female. She is here today for a [facility] and removal of oviducts due to her large fibroids which are causing her daily pain and menorrhagia.  Pertinent Gynecological History: Menses: flow is excessive with use of 18 pads or tampons on heaviest days Bleeding: menorrhagia Contraception: none DES exposure: denies Blood transfusions: none Sexually transmitted diseases: no past history Previous GYN Procedures: none  Last mammogram: n/a Date: n/a Last pap: normal Date: 12/13 OB History: G0, P0   Menstrual History: Menarche age: 40 No LMP recorded.    Past Medical History  Diagnosis Date  . Abnormal Pap smear 2013    "a few months ago.  I have to have it repeated"  . Medical history non-contributory     Past Surgical History  Procedure Laterality Date  . Appendectomy  4 yrs ago    Family History  Problem Relation Age of Onset  . Cancer Father   . Cancer Paternal Aunt   . Cancer Paternal Grandmother     Social History:  reports that she has been smoking Cigarettes.  She has a .5 pack-year smoking history. She does not have any smokeless tobacco history on file. She reports that she drinks about 7.8 ounces of alcohol per week. She reports that she uses illicit drugs (Marijuana).  Allergies:  Allergies  Allergen Reactions  . Bee Venom     Swelling, Headache, Nausea    Prescriptions prior to admission  Medication Sig Dispense Refill  . ibuprofen (ADVIL,MOTRIN) 800 MG tablet Take 1 tablet (800 mg total) by mouth every 8 (eight) hours as needed for pain.  30 tablet  0  . Multiple Vitamin (MULTIVITAMIN) tablet Take 1 tablet by mouth daily.        ROS Monogamous for 1 year. She has a girlfriend in [place]. She lives with her mother. She is unemployed.  Blood pressure 125/87, pulse 93, temperature 98.1 F (36.7 C), resp. rate 18, SpO2 100.00%. Physical Exam Heart- rrr Lungs- CTAB Abd- benign, uterus palpable above the umbilicus (U+2)  Results for  orders placed during the hospital encounter of 07/29/12 (from the past 24 hour(s))  PREGNANCY, URINE     Status: None   Collection Time    07/29/12  7:50 AM      Result Value Range   Preg Test, Ur NEGATIVE  NEGATIVE    No results found.  Assessment/Plan: Symptomatic fibroids- She declines AUE and depo Lupron.  She understands the risks of surgery, including, but not to infection, bleeding, DVTs, damage to bowel, bladder, ureters. She wishes to proceed.    Devarius Nelles C. 07/29/2012, 9:15 AM

## 2012-07-29 NOTE — Transfer of Care (Signed)
Immediate Anesthesia Transfer of Care Note  Patient: Sara Barber  Procedure(s) Performed: Procedure(s) with comments: HYSTERECTOMY SUPRACERVICAL ABDOMINAL (Bilateral) - With removal of both oviducts  Patient Location: PACU  Anesthesia Type:Spinal  Level of Consciousness: awake, alert , oriented and patient cooperative  Airway & Oxygen Therapy: Patient Spontanous Breathing and Patient connected to nasal cannula oxygen  Post-op Assessment: Report given to PACU RN and Post -op Vital signs reviewed and stable  Post vital signs: Reviewed and stable  Complications: No apparent anesthesia complications

## 2012-07-29 NOTE — Anesthesia Preprocedure Evaluation (Addendum)
Anesthesia Evaluation  Patient identified by MRN, date of birth, ID band Patient awake    Reviewed: Allergy & Precautions, H&P , Patient's Chart, lab work & pertinent test results, reviewed documented beta blocker date and time   History of Anesthesia Complications Negative for: history of anesthetic complications  Airway Mallampati: III TM Distance: >3 FB Neck ROM: full    Dental no notable dental hx.    Pulmonary neg pulmonary ROS,  breath sounds clear to auscultation  Pulmonary exam normal       Cardiovascular Exercise Tolerance: Good negative cardio ROS  Rhythm:regular Rate:Normal     Neuro/Psych negative neurological ROS  negative psych ROS   GI/Hepatic negative GI ROS, Neg liver ROS,   Endo/Other  negative endocrine ROS  Renal/GU negative Renal ROS     Musculoskeletal   Abdominal   Peds  Hematology negative hematology ROS (+)   Anesthesia Other Findings   Reproductive/Obstetrics negative OB ROS                           Anesthesia Physical Anesthesia Plan  ASA: II  Anesthesia Plan: Spinal   Post-op Pain Management:    Induction:   Airway Management Planned:   Additional Equipment:   Intra-op Plan:   Post-operative Plan:   Informed Consent: I have reviewed the patients History and Physical, chart, labs and discussed the procedure including the risks, benefits and alternatives for the proposed anesthesia with the patient or authorized representative who has indicated his/her understanding and acceptance.   Dental Advisory Given  Plan Discussed with: CRNA and Surgeon  Anesthesia Plan Comments:        Anesthesia Quick Evaluation

## 2012-07-30 ENCOUNTER — Encounter (HOSPITAL_COMMUNITY): Payer: Self-pay | Admitting: Obstetrics & Gynecology

## 2012-07-30 LAB — CBC
Hemoglobin: 10.1 g/dL — ABNORMAL LOW (ref 12.0–15.0)
MCHC: 33.6 g/dL (ref 30.0–36.0)
RBC: 3.22 MIL/uL — ABNORMAL LOW (ref 3.87–5.11)
WBC: 9.9 10*3/uL (ref 4.0–10.5)

## 2012-07-30 MED ORDER — PNEUMOCOCCAL VAC POLYVALENT 25 MCG/0.5ML IJ INJ
0.5000 mL | INJECTION | INTRAMUSCULAR | Status: AC
  Administered 2012-07-31: 0.5 mL via INTRAMUSCULAR
  Filled 2012-07-30: qty 0.5

## 2012-07-30 MED ORDER — BISACODYL 10 MG RE SUPP
10.0000 mg | Freq: Every day | RECTAL | Status: DC | PRN
  Administered 2012-07-30: 10 mg via RECTAL
  Filled 2012-07-30: qty 1

## 2012-07-30 NOTE — Progress Notes (Signed)
1 Day Post-Op Procedure(s) (LRB): HYSTERECTOMY SUPRACERVICAL ABDOMINAL (Bilateral)  Subjective: Patient reports ambulating without difficulty, c/o no flatus but no n/v.  Objective: I have reviewed patient's vital signs, intake and output, medications and labs.  General: alert Resp: clear to auscultation bilaterally Cardio: regular rate and rhythm, S1, S2 normal, no murmur, click, rub or gallop GI: soft, non-tender; bowel sounds normal; no masses,  no organomegaly Dressing: c/d/i Assessment: s/p Procedure(s) with comments: HYSTERECTOMY SUPRACERVICAL ABDOMINAL (Bilateral) - With removal of both oviducts: stable and progressing well  Plan: Advance diet Encourage ambulation  LOS: 1 day    Dariusz Brase C. 07/30/2012, 8:18 AM

## 2012-07-30 NOTE — Progress Notes (Signed)
UR completed 

## 2012-07-31 MED ORDER — OXYCODONE-ACETAMINOPHEN 5-325 MG PO TABS: 1.0000 | ORAL_TABLET | ORAL | Status: AC | PRN

## 2012-07-31 MED ORDER — IBUPROFEN 800 MG PO TABS: 800.0000 mg | ORAL_TABLET | Freq: Three times a day (TID) | ORAL | Status: AC | PRN

## 2012-07-31 NOTE — Progress Notes (Signed)
Patient discharged home via wheelchair.  

## 2012-07-31 NOTE — Discharge Summary (Signed)
Physician Discharge Summary  Patient ID: IDA MILBRATH MRN: [id number] DOB/AGE: Feb 17, 1981 31 y.o.  Admit date: 07/29/2012 Discharge date: 07/31/2012  Admission Diagnoses: symptomatic fibroids   Discharge Diagnoses: same Active Problems:   Fibroids   Discharged Condition: good  Hospital Course: She underwent an uncomplicated supracervical hysterectomy and bilateral salpingectomy under spinal anesthesia. Post operatively she did well. She was ambulating, voiding, tolerating po without nausea or vomitting, and having flatus by POD #1. She voiced her readiness to go home on POD #2.  Consults: None  Significant Diagnostic Studies: labs: Post op hbg 10.1 (pre op 12/1)  Treatments: surgery: as above  Discharge Exam: Blood pressure 123/86, pulse 85, temperature 98.3 F (36.8 C), temperature source Oral, resp. rate 18, height 5\' 1"  (1.549 m), weight 90.946 kg (200 lb 8 oz), SpO2 99.00%. General appearance: alert Resp: clear to auscultation bilaterally Cardio: regular rate and rhythm, S1, S2 normal, no murmur, click, rub or gallop GI: soft, non-tender; bowel sounds normal; no masses,  no organomegaly Incision/Wound:c/d/i/steristrips in place  Disposition: 01-Home or Self Care     Medication List    TAKE these medications       ibuprofen 800 MG tablet  Commonly known as:  ADVIL,MOTRIN  Take 1 tablet (800 mg total) by mouth every 8 (eight) hours as needed (mild pain).     multivitamin tablet  Take 1 tablet by mouth daily.     oxyCODONE-acetaminophen 5-325 MG per tablet  Commonly known as:  PERCOCET/ROXICET  Take 1-2 tablets by mouth every 4 (four) hours as needed.           Follow-up Information   Follow up with Kharon Hixon C., MD. Schedule an appointment as soon as possible for a visit in 6 weeks.   Contact information:   [address] [place] [address] [phone]       Signed: Allie Bossier 07/31/2012, 11:45 AM

## 2012-08-14 ENCOUNTER — Ambulatory Visit (INDEPENDENT_AMBULATORY_CARE_PROVIDER_SITE_OTHER): Payer: Medicaid Other | Admitting: Obstetrics & Gynecology

## 2012-08-14 ENCOUNTER — Encounter: Payer: Self-pay | Admitting: Obstetrics & Gynecology

## 2012-08-14 VITALS — BP 117/82 | HR 107 | Temp 97.3°F | Resp 20 | Ht 61.0 in | Wt 196.4 lb

## 2012-08-14 DIAGNOSIS — Z09 Encounter for follow-up examination after completed treatment for conditions other than malignant neoplasm: Secondary | ICD-10-CM

## 2012-08-14 NOTE — Progress Notes (Signed)
  Subjective:    Patient ID: Sara Barber, female    DOB: 04/10/1981, 32 y.o.   MRN: [id number]  HPI  32 yo AA lady who is 2 1/2 weeks post op s/p supracervical hyst/bilateral salpingectomy. She is here earlier than the regular 6 week post op visit because she wants to leave next week to visit her girlfriend in [place]. She has no complaints and has returned to her activities of daily living.  Review of Systems     Objective:   Physical Exam  Well-healed incision Benign abdomen      Assessment & Plan:  Post op- doing well RTC 1 year/ prn sooner

## 2012-08-14 NOTE — Progress Notes (Signed)
Pt has not had a cigarette in 2 weeks

## 2013-03-14 ENCOUNTER — Encounter (HOSPITAL_COMMUNITY): Payer: Self-pay | Admitting: Emergency Medicine

## 2013-03-14 ENCOUNTER — Emergency Department (INDEPENDENT_AMBULATORY_CARE_PROVIDER_SITE_OTHER)
Admission: EM | Admit: 2013-03-14 | Discharge: 2013-03-14 | Disposition: A | Discharge status: Home / Self Care | Payer: Medicaid Other | Source: Home / Self Care | Attending: Family Medicine | Admitting: Family Medicine

## 2013-03-14 DIAGNOSIS — H109 Unspecified conjunctivitis: Secondary | ICD-10-CM

## 2013-03-14 MED ORDER — TOBRAMYCIN 0.3 % OP SOLN
1.0000 [drp] | Freq: Once | OPHTHALMIC | Status: AC
  Administered 2013-03-14: 1 [drp] via OPHTHALMIC

## 2013-03-14 MED ORDER — TETRACAINE HCL 0.5 % OP SOLN
OPHTHALMIC | Status: AC
  Filled 2013-03-14: qty 2

## 2013-03-14 MED ORDER — TOBRAMYCIN 0.3 % OP SOLN
OPHTHALMIC | Status: AC
  Filled 2013-03-14: qty 5

## 2013-03-14 NOTE — ED Notes (Signed)
Left eye irritated yesterday, this morning woke to left eye drainage, swelling and pain.  Left eye is red.  Patient does not know what happened.  Patient does work at [employer] -repots a lot of dust and dirt .  No change in vision.  Does not wear contacts or glasses

## 2013-03-14 NOTE — ED Provider Notes (Signed)
CSN: [id number]     Arrival date & time 03/14/13  1012 History   First MD Initiated Contact with Patient 03/14/13 1054     Chief Complaint  Patient presents with  . Eye Pain   (Consider location/radiation/quality/duration/timing/severity/associated sxs/prior Treatment) Patient is a 32 y.o. female presenting with eye pain. The history is provided by the patient.  Eye Pain This is a new problem. The current episode started yesterday (gritty feeling yest, today matted drainage and lid sticking.). The problem has been gradually worsening.    Past Medical History  Diagnosis Date  . Abnormal Pap smear 2013    "a few months ago.  I have to have it repeated"  . Medical history non-contributory    Past Surgical History  Procedure Laterality Date  . Appendectomy  4 yrs ago  . Supracervical abdominal hysterectomy Bilateral 07/29/2012    Procedure: HYSTERECTOMY SUPRACERVICAL ABDOMINAL;  Surgeon: Allie Bossier, MD;  Location: [facility];  Service: Gynecology;  Laterality: Bilateral;  With removal of both oviducts  . Abdominal hysterectomy     Family History  Problem Relation Age of Onset  . Cancer Father   . Cancer Paternal Aunt   . Cancer Paternal Grandmother    History  Substance Use Topics  . Smoking status: Light Tobacco Smoker -- 0.25 packs/day for 2 years    Types: Cigarettes  . Smokeless tobacco: Not on file  . Alcohol Use: 7.8 oz/week    5 Cans of beer, 8 Shots of liquor per week   OB History   Grav Para Term Preterm Abortions TAB SAB Ect Mult Living   0              Review of Systems  Constitutional: Negative.   HENT: Negative.   Eyes: Positive for pain, discharge and redness. Negative for photophobia, itching and visual disturbance.  Respiratory: Negative.     Allergies  Bee venom  Home Medications   Current Outpatient Rx  Name  Route  Sig  Dispense  Refill  . ibuprofen (ADVIL,MOTRIN) 800 MG tablet   Oral   Take 1 tablet (800 mg total) by mouth every 8 (eight) hours  as needed (mild pain).   60 tablet   1   . Multiple Vitamin (MULTIVITAMIN) tablet   Oral   Take 1 tablet by mouth daily.         Marland Kitchen oxyCODONE-acetaminophen (PERCOCET/ROXICET) 5-325 MG per tablet   Oral   Take 1-2 tablets by mouth every 4 (four) hours as needed.   30 tablet   0    BP 117/84  Pulse 90  Temp(Src) 98.3 F (36.8 C) (Oral)  Resp 14  SpO2 100%  LMP 07/15/2012 Physical Exam  Nursing note and vitals reviewed. Constitutional: She is oriented to person, place, and time. She appears well-developed and well-nourished.  HENT:  Head: Normocephalic.  Right Ear: External ear normal.  Left Ear: External ear normal.  Mouth/Throat: Oropharynx is clear and moist.  Eyes: EOM and lids are normal. Pupils are equal, round, and reactive to light. Lids are everted and swept, no foreign bodies found. Left conjunctiva is injected. Left conjunctiva has no hemorrhage.  Slit lamp exam:      The left eye shows no corneal abrasion, no corneal ulcer, no foreign body and no fluorescein uptake.  Neck: Normal range of motion. Neck supple.  Lymphadenopathy:    She has no cervical adenopathy.  Neurological: She is alert and oriented to person, place, and time.  Skin:  Skin is warm and dry.    ED Course  Procedures (including critical care time) Labs Review Labs Reviewed - No data to display Imaging Review No results found.  EKG Interpretation     Ventricular Rate:    PR Interval:    QRS Duration:   QT Interval:    QTC Calculation:   R Axis:     Text Interpretation:              MDM      Linna Hoff, MD 03/14/13 [phone]

## 2014-03-01 IMAGING — US US PELVIS COMPLETE
1 series · 13 of 25 positions shown · non-contrast
Comparison: No priors.

CLINICAL DATA: Left adnexal pain.



[Series 1: us pelvis complete · 13 of 67 slices shown]
[im 1/67]
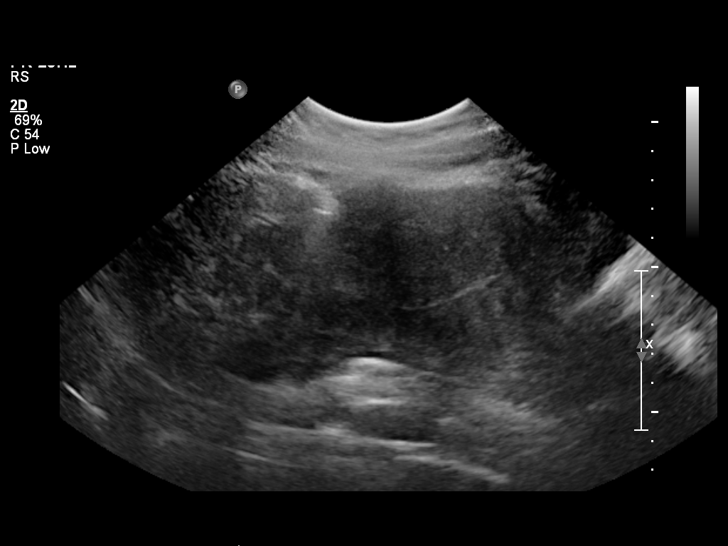
[im 6/67]
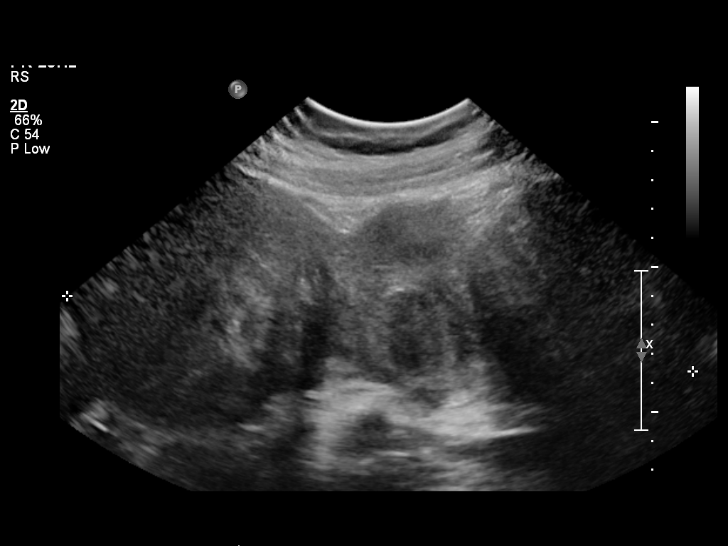
[im 12/67]
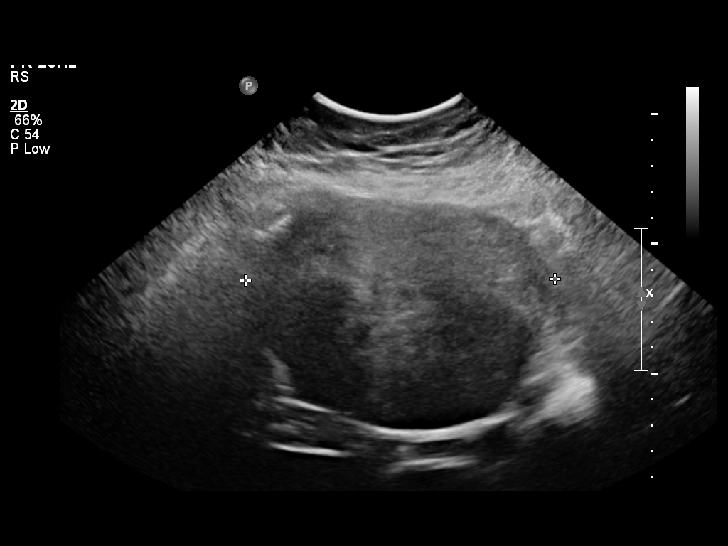
[im 17/67]
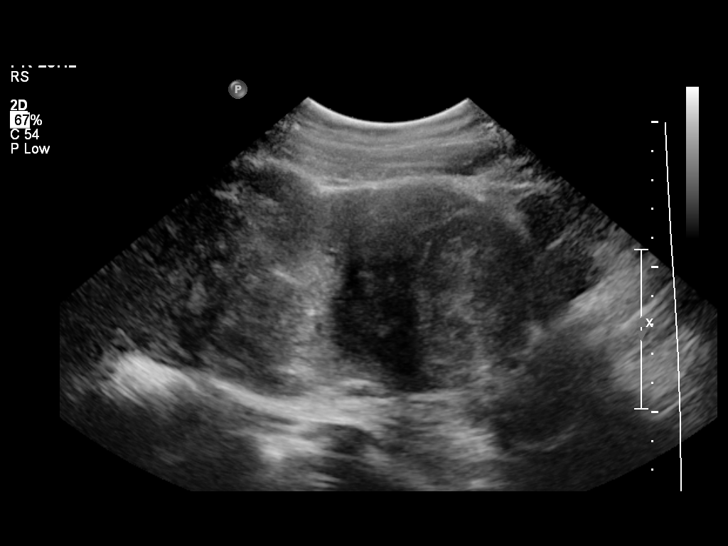
[im 23/67]
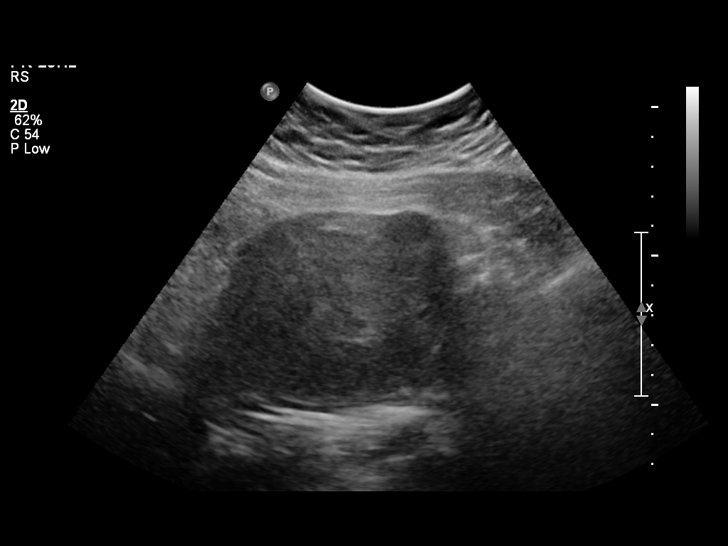
[im 28/67]
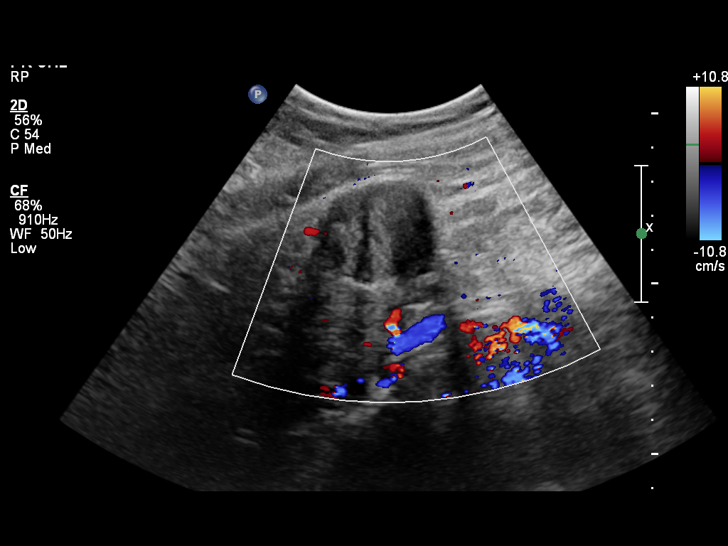
[im 34/67]
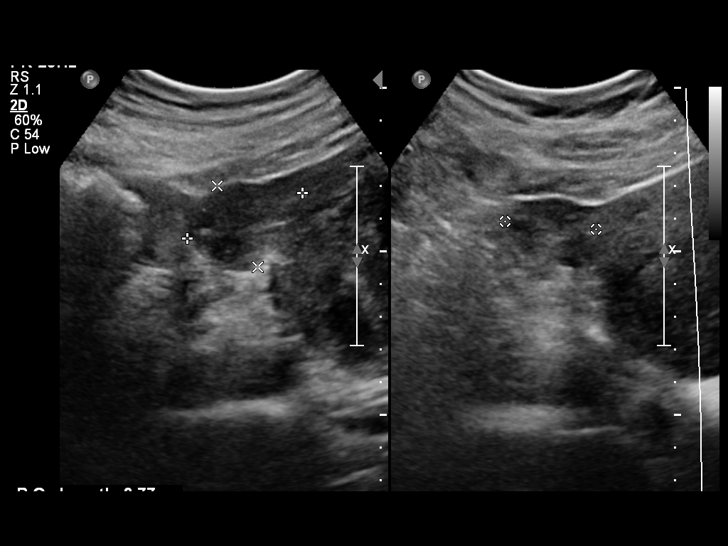
[im 39/67]
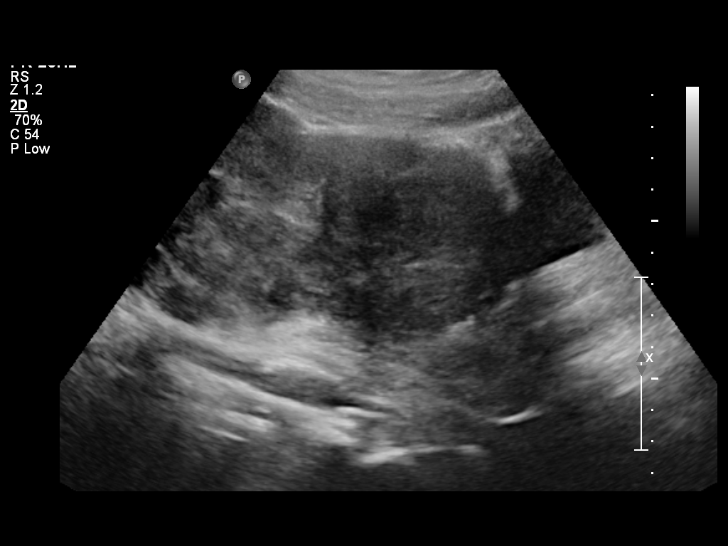
[im 45/67]
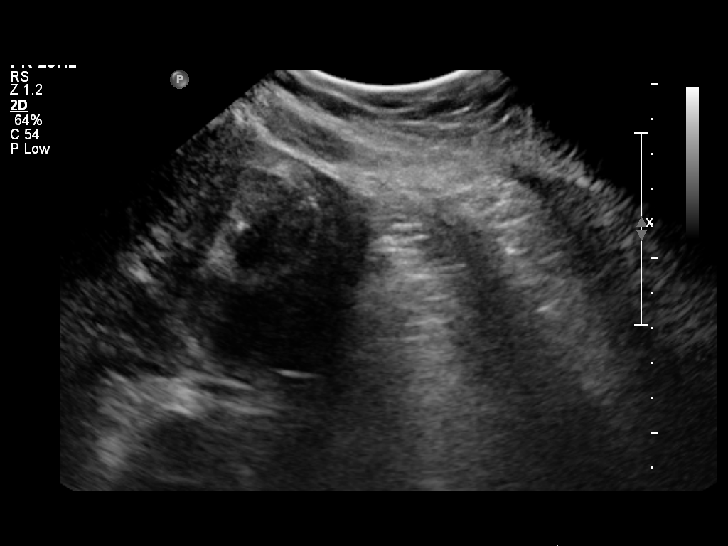
[im 50/67]
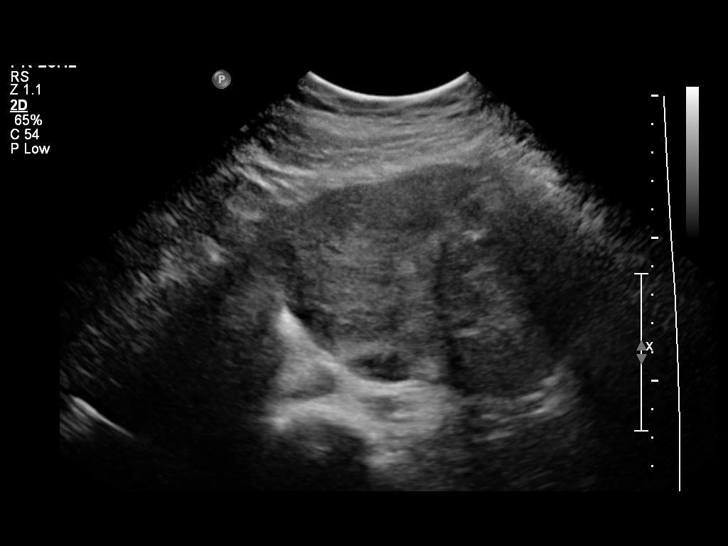
[im 56/67]
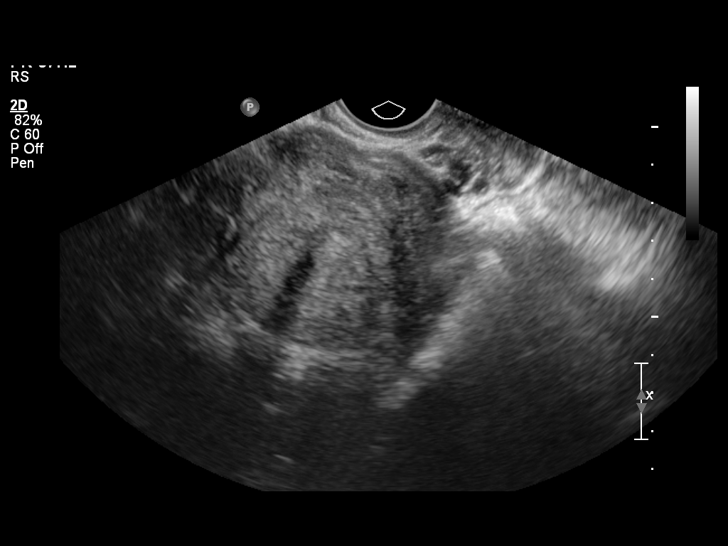
[im 61/67]
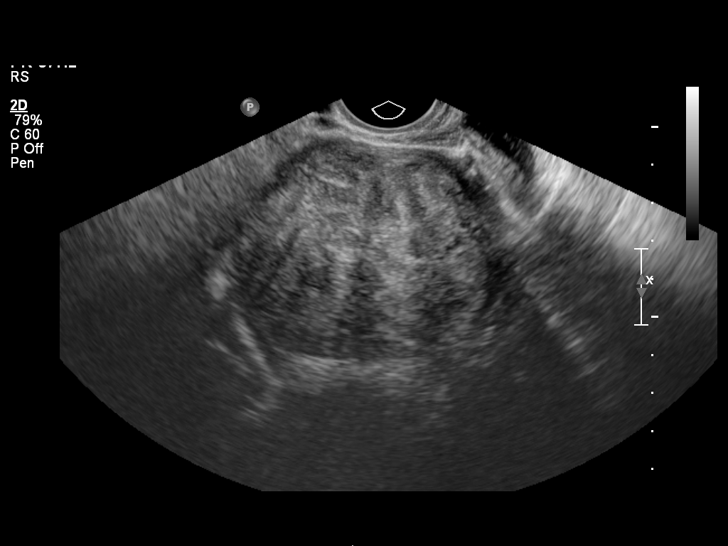
[im 67/67]
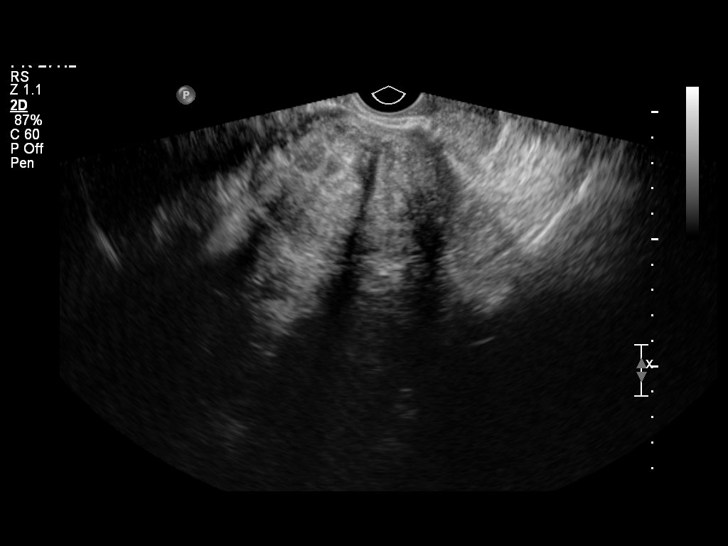

[13 of 25 positions shown; findings below may reference images not displayed]

FINDINGS: Uterus: Markedly heterogeneous in echotexture with numerous focal
lesions most compatible with fibroids.  22 x 7.5 x 17.9 cm.  The
largest single fibroid is on the right side measuring up to 11.2 x
8.6 x 11.9 cm.  There are at least six separate lesions (likely
more) visualized.

Endometrium: Largely obscured, but the visualized portion measures
up to 7.7 mm in thickness.

Right ovary:  3.8 x 2.8 x 2.8 cm.  Normal in echotexture and
appearance with multiple small follicles.

Left ovary: Could not be visualized.

Other findings: No free fluid the cul-de-sac.
IMPRESSION: 1.  Fibroid uterus, as above.
2.  Normal sonographic appearance of the right ovary.
3.  Limited examination which was not able to visualize the left
ovary..

## 2016-12-01 ENCOUNTER — Encounter (HOSPITAL_COMMUNITY): Payer: Self-pay

## 2016-12-01 ENCOUNTER — Ambulatory Visit (HOSPITAL_COMMUNITY)
Admission: EM | Admit: 2016-12-01 | Discharge: 2016-12-01 | Disposition: A | Discharge status: Home / Self Care | Payer: Medicaid Other | Attending: Internal Medicine | Admitting: Internal Medicine

## 2016-12-01 DIAGNOSIS — K029 Dental caries, unspecified: Secondary | ICD-10-CM

## 2016-12-01 DIAGNOSIS — K047 Periapical abscess without sinus: Secondary | ICD-10-CM

## 2016-12-01 MED ORDER — AMOXICILLIN 500 MG PO CAPS: 500.0000 mg | ORAL_CAPSULE | Freq: Three times a day (TID) | ORAL | 0 refills | Status: AC

## 2016-12-01 NOTE — ED Triage Notes (Addendum)
Patient presents to [facility] with abscess in mouth accompanied with swelling, pt has taken OTC medications to help with pain but has no relief

## 2016-12-01 NOTE — Discharge Instructions (Signed)
Start ibuprofen 600-800mg  three times a day for pain and inflammation. Start amoxicillin as directed for dental abscess. He can continue to use Orajel for pain. Follow-up with dentist for further treatment. Monitor for worsening of symptoms, fever, swelling of the throat, trouble breathing, trouble swallowing, to follow-up for further evaluation.

## 2016-12-01 NOTE — ED Provider Notes (Signed)
CSN: [id number]     Arrival date & time 12/01/16  1754 History   None    Chief Complaint  Patient presents with  . Mouth Lesions    abscess   (Consider location/radiation/quality/duration/timing/severity/associated sxs/prior Treatment) 36 year old female comes in with two-day history of dental pain. She has known cavity on right bottom molar. States there is now increased swelling and pain. She states again ibuprofen, naproxen,  Tylenol without relief. Denies fever, chills, night sweats. Denies swelling of the throat, trouble breathing, trouble swallowing.       Past Medical History:  Diagnosis Date  . Abnormal Pap smear 2013   "a few months ago.  I have to have it repeated"  . Medical history non-contributory    Past Surgical History:  Procedure Laterality Date  . ABDOMINAL HYSTERECTOMY    . APPENDECTOMY  4 yrs ago  . SUPRACERVICAL ABDOMINAL HYSTERECTOMY Bilateral 07/29/2012   Procedure: HYSTERECTOMY SUPRACERVICAL ABDOMINAL;  Surgeon: Emily Filbert, MD;  Location: [facility];  Service: Gynecology;  Laterality: Bilateral;  With removal of both oviducts   Family History  Problem Relation Age of Onset  . Cancer Father   . Cancer Paternal Aunt   . Cancer Paternal Grandmother    Social History  Substance Use Topics  . Smoking status: Light Tobacco Smoker    Packs/day: 0.25    Years: 2.00    Types: Cigarettes  . Smokeless tobacco: Never Used  . Alcohol use 7.8 oz/week    5 Cans of beer, 8 Shots of liquor per week   OB History    Gravida Para Term Preterm AB Living   0             SAB TAB Ectopic Multiple Live Births                 Review of Systems  Reason unable to perform ROS: See HPI as above.    Allergies  Bee venom  Home Medications   Prior to Admission medications   Medication Sig Start Date End Date Taking? Authorizing Provider  ibuprofen (ADVIL,MOTRIN) 800 MG tablet Take 1 tablet (800 mg total) by mouth every 8 (eight) hours as needed (mild pain). 07/31/12   Yes Dove, Myra C, MD  amoxicillin (AMOXIL) 500 MG capsule Take 1 capsule (500 mg total) by mouth 3 (three) times daily. 12/01/16 12/11/16  Ok Edwards, PA-C  Multiple Vitamin (MULTIVITAMIN) tablet Take 1 tablet by mouth daily.    [provider]  oxyCODONE-acetaminophen (PERCOCET/ROXICET) 5-325 MG per tablet Take 1-2 tablets by mouth every 4 (four) hours as needed. 07/31/12   Emily Filbert, MD   Meds Ordered and Administered this Visit  Medications - No data to display  BP 115/83 (BP Location: Left Arm)   Pulse 79   Temp 98.2 F (36.8 C) (Oral)   Resp 16   LMP 07/15/2012   SpO2 100%  No data found.   Physical Exam  Constitutional: She is oriented to person, place, and time. She appears well-developed and well-nourished. No distress.  HENT:  Mouth/Throat: Uvula is midline, oropharynx is clear and moist and mucous membranes are normal. Dental abscesses and dental caries present.    Neurological: She is alert and oriented to person, place, and time.  Skin: Skin is warm and dry.  Psychiatric: She has a normal mood and affect. Her behavior is normal. Judgment normal.    Urgent Care Course     Procedures (including critical care time)  Labs Review  Labs Reviewed - No data to display  Imaging Review No results found.       MDM   1. Dental abscess    Discussed with patient possible abscess felt around dental cavity. Discussed with patient will put on antibiotics to help with abscess, but this may return if cavity is not addressed. Start amoxicillin as directed. Take ibuprofen as directed for 10 days. Follow-up with dentist for further treatment needed. Monitor for worsening of symptoms, fever, swelling of the throat, trouble swallowing, to follow up for further evaluation.    Ok Edwards, PA-C 12/01/16 1921
